# Patient Record
Sex: Male | Born: 1965 | Race: Black or African American | Hispanic: No | Marital: Married | Smoking: Never smoker
Health system: Southern US, Community
[De-identification: ages and names within clinical notes are randomized; demographics above are authoritative.]

---

## 2007-05-06 ENCOUNTER — Emergency Department (HOSPITAL_COMMUNITY): Admission: EM | Admit: 2007-05-06 | Discharge: 2007-05-06 | Payer: Self-pay | Admitting: Emergency Medicine

## 2007-07-25 ENCOUNTER — Emergency Department (HOSPITAL_COMMUNITY): Admission: EM | Admit: 2007-07-25 | Discharge: 2007-07-25 | Payer: Self-pay | Admitting: Emergency Medicine

## 2010-03-20 ENCOUNTER — Emergency Department (HOSPITAL_BASED_OUTPATIENT_CLINIC_OR_DEPARTMENT_OTHER)
Admission: EM | Admit: 2010-03-20 | Discharge: 2010-03-20 | Disposition: A | Discharge status: Home / Self Care | Payer: BC Managed Care – PPO | Attending: Emergency Medicine | Admitting: Emergency Medicine

## 2010-03-20 DIAGNOSIS — J069 Acute upper respiratory infection, unspecified: Secondary | ICD-10-CM | POA: Insufficient documentation

## 2010-10-08 LAB — CBC
MCHC: 33.8
MCV: 91.2
Platelets: 243
RBC: 4.77
RDW: 12.4

## 2010-10-08 LAB — POCT CARDIAC MARKERS: Myoglobin, poc: 84.7

## 2010-10-08 LAB — POCT I-STAT, CHEM 8
Creatinine, Ser: 1.5
HCT: 42
Hemoglobin: 14.3
Sodium: 140
TCO2: 26

## 2010-10-08 LAB — DIFFERENTIAL
Basophils Absolute: 0
Basophils Relative: 1
Eosinophils Absolute: 0.1
Monocytes Relative: 8
Neutro Abs: 3.6
Neutrophils Relative %: 47

## 2010-10-10 LAB — BASIC METABOLIC PANEL
BUN: 11
Calcium: 9.3
Creatinine, Ser: 1.19
GFR calc non Af Amer: >60
Glucose, Bld: 103 — ABNORMAL HIGH

## 2010-10-10 LAB — DIFFERENTIAL
Basophils Relative: 0
Eosinophils Absolute: 0.1
Monocytes Absolute: 1
Monocytes Relative: 8

## 2010-10-10 LAB — CBC
Hemoglobin: 15.3
MCHC: 35.2
MCV: 89.2
RBC: 4.87
RDW: 12.4

## 2013-03-29 ENCOUNTER — Emergency Department (HOSPITAL_BASED_OUTPATIENT_CLINIC_OR_DEPARTMENT_OTHER)
Admission: EM | Admit: 2013-03-29 | Discharge: 2013-03-29 | Disposition: A | Discharge status: Home / Self Care | Payer: BC Managed Care – PPO | Attending: Emergency Medicine | Admitting: Emergency Medicine

## 2013-03-29 ENCOUNTER — Encounter (HOSPITAL_BASED_OUTPATIENT_CLINIC_OR_DEPARTMENT_OTHER): Payer: Self-pay | Admitting: Emergency Medicine

## 2013-03-29 ENCOUNTER — Emergency Department (HOSPITAL_BASED_OUTPATIENT_CLINIC_OR_DEPARTMENT_OTHER): Payer: BC Managed Care – PPO

## 2013-03-29 DIAGNOSIS — Y939 Activity, unspecified: Secondary | ICD-10-CM | POA: Insufficient documentation

## 2013-03-29 DIAGNOSIS — Y929 Unspecified place or not applicable: Secondary | ICD-10-CM | POA: Insufficient documentation

## 2013-03-29 DIAGNOSIS — X58XXXA Exposure to other specified factors, initial encounter: Secondary | ICD-10-CM | POA: Insufficient documentation

## 2013-03-29 DIAGNOSIS — S39012A Strain of muscle, fascia and tendon of lower back, initial encounter: Secondary | ICD-10-CM

## 2013-03-29 DIAGNOSIS — S335XXA Sprain of ligaments of lumbar spine, initial encounter: Secondary | ICD-10-CM | POA: Insufficient documentation

## 2013-03-29 LAB — URINALYSIS, ROUTINE W REFLEX MICROSCOPIC
Bilirubin Urine: NEGATIVE
GLUCOSE, UA: NEGATIVE mg/dL
Hgb urine dipstick: NEGATIVE
Ketones, ur: NEGATIVE mg/dL
LEUKOCYTES UA: NEGATIVE
NITRITE: NEGATIVE
PH: 5.5 (ref 5.0–8.0)
Protein, ur: NEGATIVE mg/dL
SPECIFIC GRAVITY, URINE: 1.016 (ref 1.005–1.030)
Urobilinogen, UA: 0.2 mg/dL (ref 0.0–1.0)

## 2013-03-29 MED ORDER — IBUPROFEN 800 MG PO TABS: 800.0000 mg | ORAL_TABLET | Freq: Three times a day (TID) | ORAL | Status: DC

## 2013-03-29 MED ORDER — PREDNISONE 10 MG PO TABS: ORAL_TABLET | ORAL | Status: DC

## 2013-03-29 NOTE — ED Provider Notes (Signed)
CSN: [id number]     Arrival date & time 03/29/13  1810 History   First MD Initiated Contact with Patient 03/29/13 1837     Chief Complaint  Patient presents with  . Back Pain     (Consider location/radiation/quality/duration/timing/severity/associated sxs/prior Treatment) Patient is a 48 y.o. male presenting with back pain. No language interpreter was used.  Back Pain Location:  Lumbar spine Quality:  Aching Radiates to:  Does not radiate Pain severity:  Moderate Pain is:  Same all the time Onset quality:  Gradual Timing:  Constant Progression:  Worsening Chronicity:  New Relieved by:  Nothing Worsened by:  Nothing tried Ineffective treatments:  None tried Associated symptoms: no abdominal pain, no fever, no headaches, no leg pain, no paresthesias and no weakness   Pt saw his MD and was given medications for pain.   Pt reports no relief and pain continues  History reviewed. No pertinent past medical history. History reviewed. No pertinent past surgical history. No family history on file. History  Substance Use Topics  . Smoking status: Never Smoker   . Smokeless tobacco: Not on file  . Alcohol Use: No    Review of Systems  Constitutional: Negative for fever.  Gastrointestinal: Negative for abdominal pain.  Musculoskeletal: Positive for back pain and myalgias.  Neurological: Negative for weakness, headaches and paresthesias.  All other systems reviewed and are negative.      Allergies  Review of patient's allergies indicates no known allergies.  Home Medications   Current Outpatient Rx  Name  Route  Sig  Dispense  Refill  . Hydrocodone-Acetaminophen (VICODIN PO)   Oral   Take by mouth.          BP 135/87  Pulse 88  Temp(Src) 98.2 F (36.8 C) (Oral)  Resp 20  Ht 5\' 10"  (1.778 m)  Wt 250 lb (113.399 kg)  BMI 35.87 kg/m2  SpO2 100% Physical Exam  Nursing note and vitals reviewed. Constitutional: He is oriented to person, place, and time. He appears  well-developed and well-nourished.  HENT:  Head: Normocephalic.  Right Ear: External ear normal.  Left Ear: External ear normal.  Eyes: Conjunctivae and EOM are normal. Pupils are equal, round, and reactive to light.  Neck: Normal range of motion.  Cardiovascular: Normal rate and normal heart sounds.   Pulmonary/Chest: Effort normal.  Abdominal: Soft. He exhibits no distension.  Musculoskeletal:  Diffusely tender ls spine,   Decreased range of mtoion,   From lower extremities  Neurological: He is alert and oriented to person, place, and time.  Psychiatric: He has a normal mood and affect.    ED Course  Procedures (including critical care time) Labs Review Labs Reviewed  URINALYSIS, ROUTINE W REFLEX MICROSCOPIC   Imaging Review Dg Lumbar Spine Complete  03/29/2013   CLINICAL DATA:  Worsening low back pain.  EXAM: LUMBAR SPINE - COMPLETE 4+ VIEW  COMPARISON:  None.  FINDINGS: There is no evidence of lumbar spine fracture.  Alignment is normal.  Mild degenerative disc disease is seen at most lumbar levels, and mild facet DJD is seen bilaterally at L5-S1. 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  IMPRESSION: No acute findings.  Mild degenerative spondylosis, as described above.   Electronically Signed   By: Earle Gell M.D.   On: 03/29/2013 19:19     EKG Interpretation None      MDM   Final diagnoses:  Lumbar strain    rx for prednisone and ibuprofen.   Schedule to see  Dr. Barbaraann Barthel for evaluation    Fransico Meadow, PA-C 03/29/13 2200

## 2013-03-29 NOTE — ED Notes (Signed)
Lower back pain x 3 weeks. States he has been taking Vicodin for the pain since last Thursday. No known injury.

## 2013-03-29 NOTE — Discharge Instructions (Signed)
Back Exercises °Back exercises help treat and prevent back injuries. The goal of back exercises is to increase the strength of your abdominal and back muscles and the flexibility of your back. These exercises should be started when you no longer have back pain. Back exercises include: °· Pelvic Tilt. Lie on your back with your knees bent. Tilt your pelvis until the lower part of your back is against the floor. Hold this position 5 to 10 sec and repeat 5 to 10 times. °· Knee to Chest. Pull first 1 knee up against your chest and hold for 20 to 30 seconds, repeat this with the other knee, and then both knees. This may be done with the other leg straight or bent, whichever feels better. °· Sit-Ups or Curl-Ups. Bend your knees 90 degrees. Start with tilting your pelvis, and do a partial, slow sit-up, lifting your trunk only 30 to 45 degrees off the floor. Take at least 2 to 3 seconds for each sit-up. Do not do sit-ups with your knees out straight. If partial sit-ups are difficult, simply do the above but with only tightening your abdominal muscles and holding it as directed. °· Hip-Lift. Lie on your back with your knees flexed 90 degrees. Push down with your feet and shoulders as you raise your hips a couple inches off the floor; hold for 10 seconds, repeat 5 to 10 times. °· Back arches. Lie on your stomach, propping yourself up on bent elbows. Slowly press on your hands, causing an arch in your low back. Repeat 3 to 5 times. Any initial stiffness and discomfort should lessen with repetition over time. °· Shoulder-Lifts. Lie face down with arms beside your body. Keep hips and torso pressed to floor as you slowly lift your head and shoulders off the floor. °Do not overdo your exercises, especially in the beginning. Exercises may cause you some mild back discomfort which lasts for a few minutes; however, if the pain is more severe, or lasts for more than 15 minutes, do not continue exercises until you see your caregiver.  Improvement with exercise therapy for back problems is slow.  °See your caregivers for assistance with developing a proper back exercise program. °Document Released: 02/07/2004 Document Revised: 03/24/2011 Document Reviewed: 10/31/2010 °Exit[employer]® Patient Information ©2014 [employer]. ° °Back Pain, Adult °Low back pain is very common. About 1 in 5 people have back pain. The cause of low back pain is rarely dangerous. The pain often gets better over time. About half of people with a sudden onset of back pain feel better in just 2 weeks. About 8 in 10 people feel better by 6 weeks.  °CAUSES °Some common causes of back pain include: °· Strain of the muscles or ligaments supporting the spine. °· Wear and tear (degeneration) of the spinal discs. °· Arthritis. °· Direct injury to the back. °DIAGNOSIS °Most of the time, the direct cause of low back pain is not known. However, back pain can be treated effectively even when the exact cause of the pain is unknown. Answering your caregiver's questions about your overall health and symptoms is one of the most accurate ways to make sure the cause of your pain is not dangerous. If your caregiver needs more information, he or she may order lab work or imaging tests (X-rays or MRIs). However, even if imaging tests show changes in your back, this usually does not require surgery. °HOME CARE INSTRUCTIONS °For many people, back pain returns. Since low back pain is rarely dangerous, it is often a condition that people   can learn to manage on their own.  °· Remain active. It is stressful on the back to sit or stand in one place. Do not sit, drive, or stand in one place for more than 30 minutes at a time. Take short walks on level surfaces as soon as pain allows. Try to increase the length of time you walk each day. °· Do not stay in bed. Resting more than 1 or 2 days can delay your recovery. °· Do not avoid exercise or work. Your body is made to move. It is not dangerous to be active,  even though your back may hurt. Your back will likely heal faster if you return to being active before your pain is gone. °· Pay attention to your body when you  bend and lift. Many people have less discomfort when lifting if they bend their knees, keep the load close to their bodies, and avoid twisting. Often, the most comfortable positions are those that put less stress on your recovering back. °· Find a comfortable position to sleep. Use a firm mattress and lie on your side with your knees slightly bent. If you lie on your back, put a pillow under your knees. °· Only take over-the-counter or prescription medicines as directed by your caregiver. Over-the-counter medicines to reduce pain and inflammation are often the most helpful. Your caregiver may prescribe muscle relaxant drugs. These medicines help dull your pain so you can more quickly return to your normal activities and healthy exercise. °· Put ice on the injured area. °· Put ice in a plastic bag. °· Place a towel between your skin and the bag. °· Leave the ice on for 15-20 minutes, 03-04 times a day for the first 2 to 3 days. After that, ice and heat may be alternated to reduce pain and spasms. °· Ask your caregiver about trying back exercises and gentle massage. This may be of some benefit. °· Avoid feeling anxious or stressed. Stress increases muscle tension and can worsen back pain. It is important to recognize when you are anxious or stressed and learn ways to manage it. Exercise is a great option. °SEEK MEDICAL CARE IF: °· You have pain that is not relieved with rest or medicine. °· You have pain that does not improve in 1 week. °· You have new symptoms. °· You are generally not feeling well. °SEEK IMMEDIATE MEDICAL CARE IF:  °· You have pain that radiates from your back into your legs. °· You develop new bowel or bladder control problems. °· You have unusual weakness or numbness in your arms or legs. °· You develop nausea or vomiting. °· You develop  abdominal pain. °· You feel faint. °Document Released: 12/30/2004 Document Revised: 07/01/2011 Document Reviewed: 05/20/2010 °ExitCare® Patient Information ©2014 [employer]. ° °

## 2013-03-30 NOTE — ED Provider Notes (Signed)
Medical screening examination/treatment/procedure(s) were performed by non-physician practitioner and as supervising physician I was immediately available for consultation/collaboration.   EKG Interpretation None        Emmanual Gauthreaux, MD 03/30/13 1430 

## 2014-08-29 DIAGNOSIS — M48061 Spinal stenosis, lumbar region without neurogenic claudication: Secondary | ICD-10-CM | POA: Insufficient documentation

## 2014-08-30 DIAGNOSIS — R339 Retention of urine, unspecified: Secondary | ICD-10-CM | POA: Insufficient documentation

## 2014-09-11 DIAGNOSIS — Z09 Encounter for follow-up examination after completed treatment for conditions other than malignant neoplasm: Secondary | ICD-10-CM | POA: Insufficient documentation

## 2015-09-25 IMAGING — CR DG LUMBAR SPINE COMPLETE 4+V
5 series · 5 of 5 positions shown · non-contrast
Comparison: None.

CLINICAL DATA: Worsening low back pain.

EXAM:
LUMBAR SPINE - COMPLETE 4+ VIEW

[t l-spine a.p.]
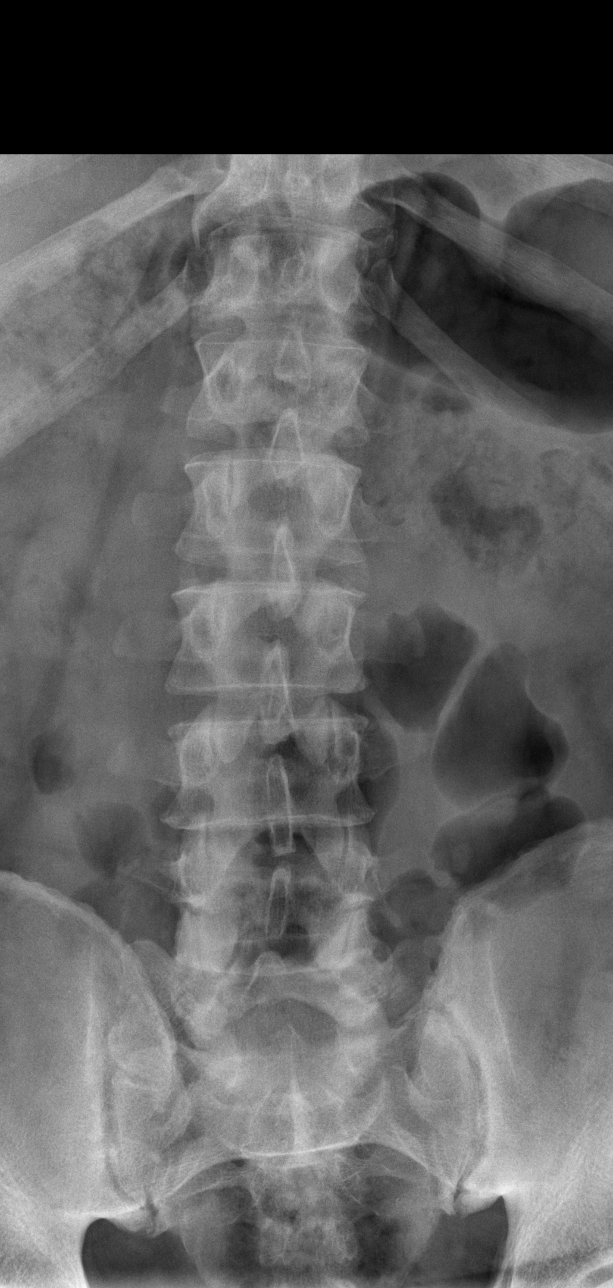

[t l-spine oblique exposure (1 of 2)]
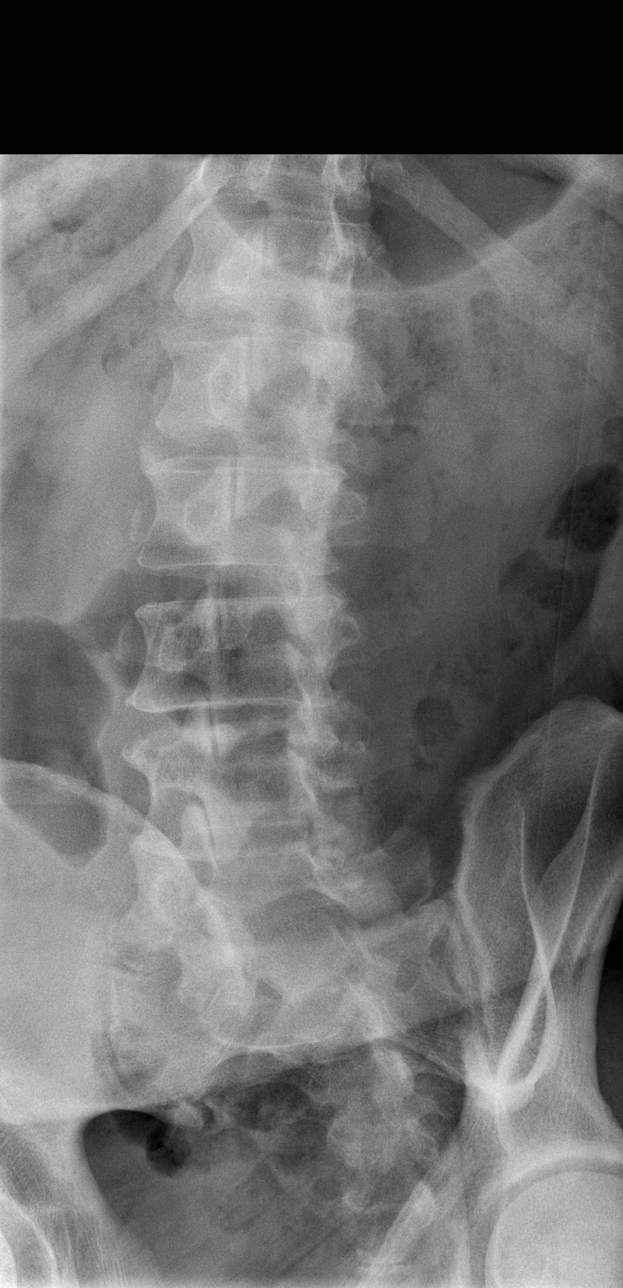

[t l-spine oblique exposure (2 of 2)]
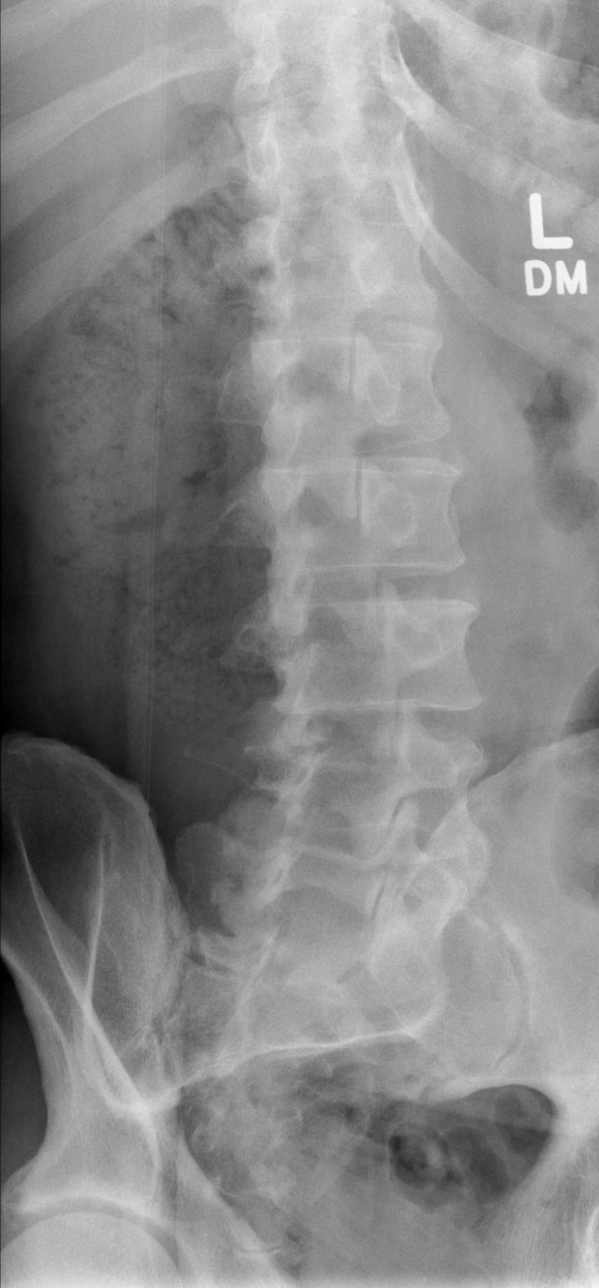

[t l-spine lat]
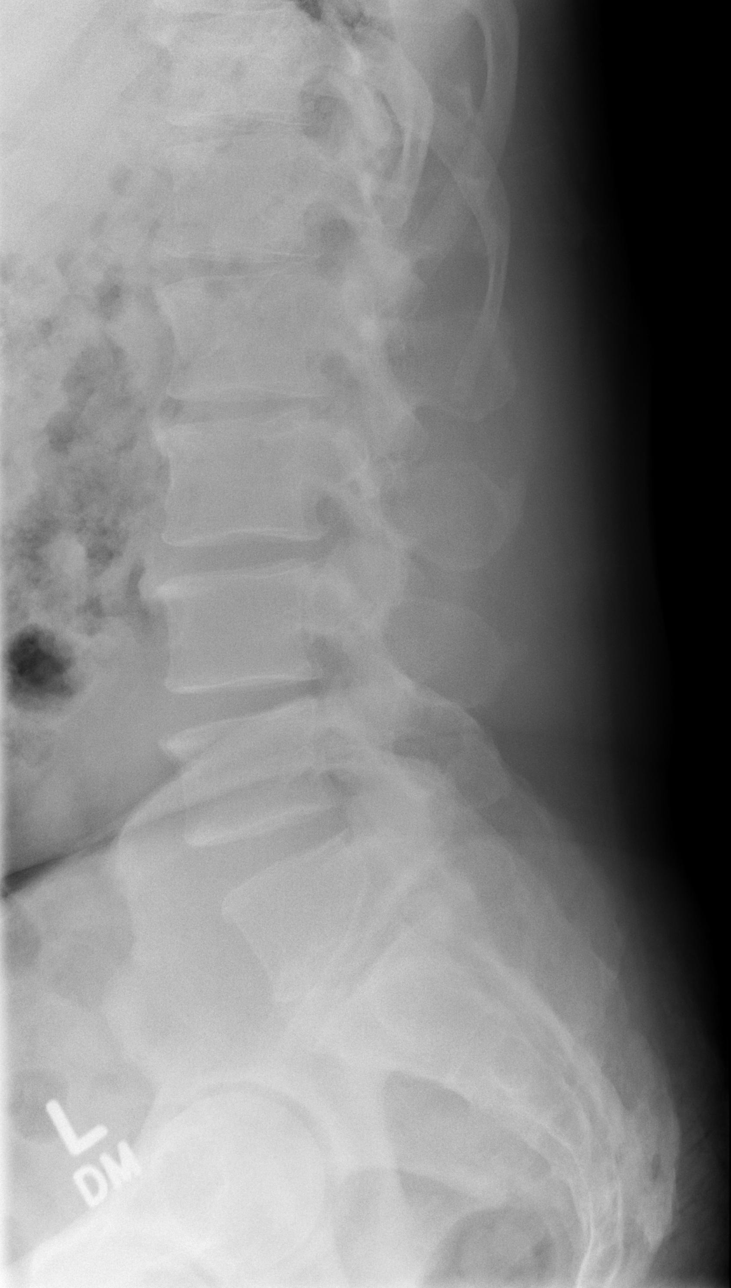

[t l-spine l5-s1 spot]
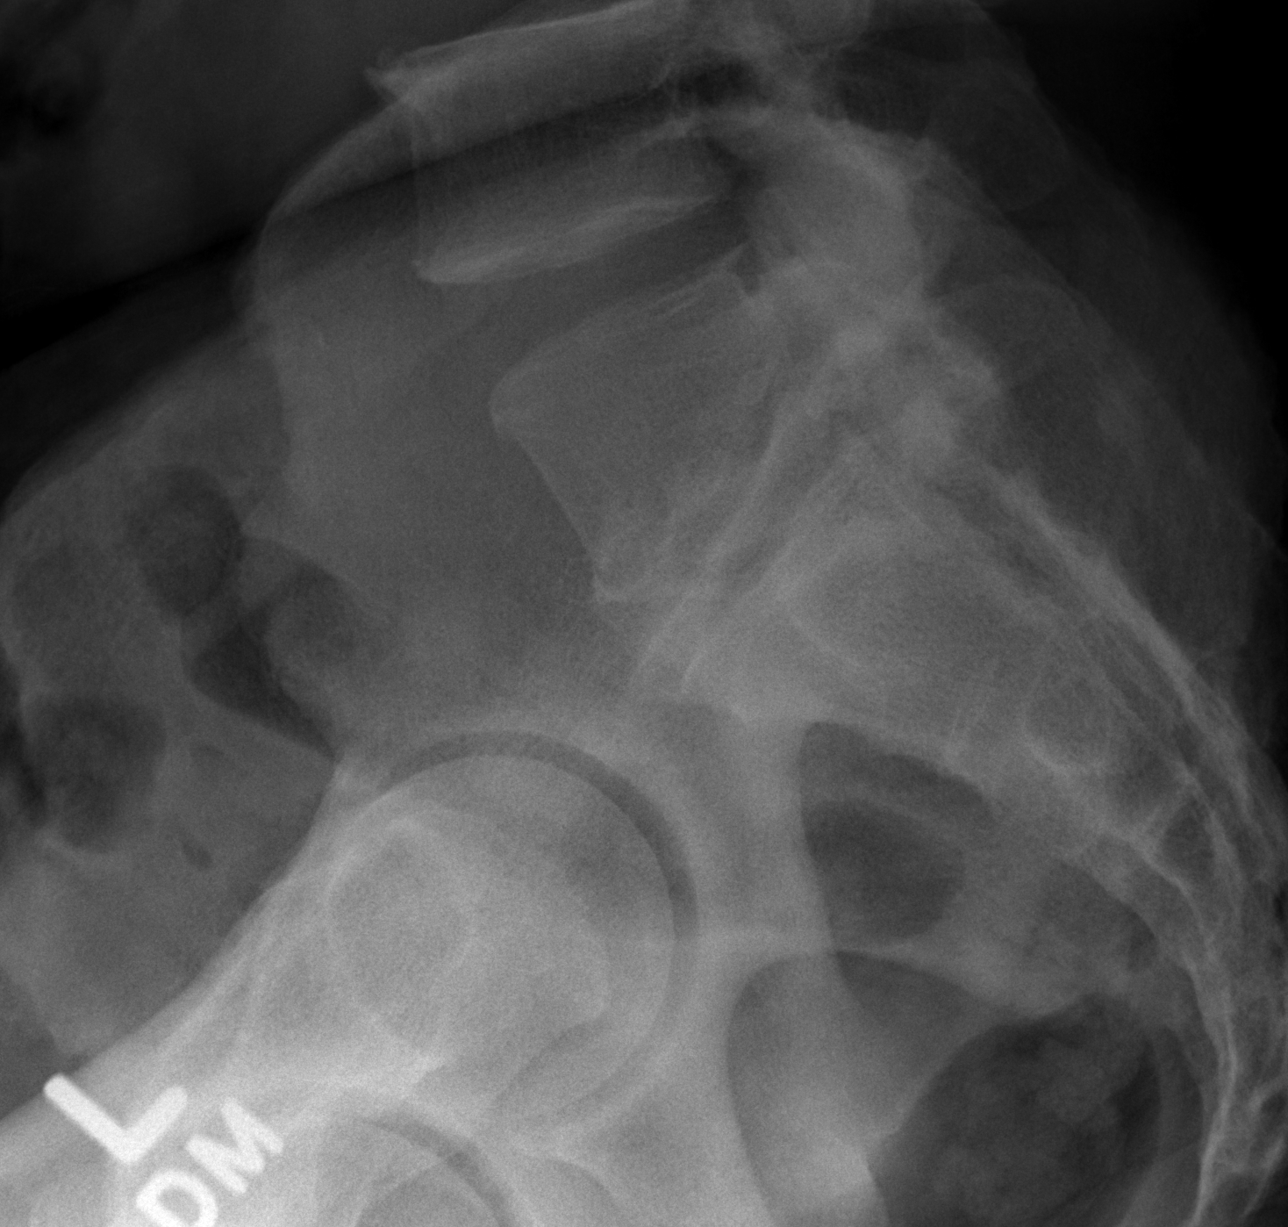

[5 of 5 positions shown; findings below may reference images not displayed]

FINDINGS: There is no evidence of lumbar spine fracture.  Alignment is normal.

Mild degenerative disc disease is seen at most lumbar levels, and
mild facet DJD is seen bilaterally at L5-S1. Transitional
lumbosacral vertebra noted at S1.
IMPRESSION: No acute findings.

Mild degenerative spondylosis, as described above.

## 2015-11-20 DIAGNOSIS — M4722 Other spondylosis with radiculopathy, cervical region: Secondary | ICD-10-CM | POA: Insufficient documentation

## 2015-11-20 DIAGNOSIS — G5621 Lesion of ulnar nerve, right upper limb: Secondary | ICD-10-CM | POA: Insufficient documentation

## 2016-06-19 DIAGNOSIS — Z9889 Other specified postprocedural states: Secondary | ICD-10-CM | POA: Insufficient documentation

## 2018-05-26 DIAGNOSIS — M47812 Spondylosis without myelopathy or radiculopathy, cervical region: Secondary | ICD-10-CM | POA: Insufficient documentation

## 2019-02-22 ENCOUNTER — Institutional Professional Consult (permissible substitution): Payer: BC Managed Care – PPO | Admitting: Emergency Medicine

## 2022-01-27 ENCOUNTER — Encounter: Payer: Self-pay | Admitting: Family Medicine

## 2022-01-27 ENCOUNTER — Ambulatory Visit (INDEPENDENT_AMBULATORY_CARE_PROVIDER_SITE_OTHER): Payer: BC Managed Care – PPO | Admitting: Family Medicine

## 2022-01-27 VITALS — BP 128/80 | HR 70 | Temp 98.1°F | Resp 16 | Ht 70.0 in | Wt 267.0 lb

## 2022-01-27 DIAGNOSIS — I10 Essential (primary) hypertension: Secondary | ICD-10-CM | POA: Diagnosis not present

## 2022-01-27 DIAGNOSIS — Z85820 Personal history of malignant melanoma of skin: Secondary | ICD-10-CM | POA: Insufficient documentation

## 2022-01-27 DIAGNOSIS — H524 Presbyopia: Secondary | ICD-10-CM | POA: Insufficient documentation

## 2022-01-27 DIAGNOSIS — R519 Headache, unspecified: Secondary | ICD-10-CM | POA: Insufficient documentation

## 2022-01-27 DIAGNOSIS — G4733 Obstructive sleep apnea (adult) (pediatric): Secondary | ICD-10-CM | POA: Diagnosis not present

## 2022-01-27 DIAGNOSIS — R634 Abnormal weight loss: Secondary | ICD-10-CM | POA: Insufficient documentation

## 2022-01-27 DIAGNOSIS — E781 Pure hyperglyceridemia: Secondary | ICD-10-CM | POA: Insufficient documentation

## 2022-01-27 DIAGNOSIS — N529 Male erectile dysfunction, unspecified: Secondary | ICD-10-CM | POA: Insufficient documentation

## 2022-01-27 DIAGNOSIS — R222 Localized swelling, mass and lump, trunk: Secondary | ICD-10-CM | POA: Insufficient documentation

## 2022-01-27 DIAGNOSIS — M25511 Pain in right shoulder: Secondary | ICD-10-CM | POA: Insufficient documentation

## 2022-01-27 DIAGNOSIS — R079 Chest pain, unspecified: Secondary | ICD-10-CM | POA: Insufficient documentation

## 2022-01-27 DIAGNOSIS — L821 Other seborrheic keratosis: Secondary | ICD-10-CM | POA: Insufficient documentation

## 2022-01-27 DIAGNOSIS — E785 Hyperlipidemia, unspecified: Secondary | ICD-10-CM | POA: Insufficient documentation

## 2022-01-27 DIAGNOSIS — Z23 Encounter for immunization: Secondary | ICD-10-CM | POA: Diagnosis not present

## 2022-01-27 DIAGNOSIS — M779 Enthesopathy, unspecified: Secondary | ICD-10-CM | POA: Insufficient documentation

## 2022-01-27 DIAGNOSIS — Z6838 Body mass index (BMI) 38.0-38.9, adult: Secondary | ICD-10-CM

## 2022-01-27 DIAGNOSIS — J338 Other polyp of sinus: Secondary | ICD-10-CM | POA: Insufficient documentation

## 2022-01-27 DIAGNOSIS — H35411 Lattice degeneration of retina, right eye: Secondary | ICD-10-CM | POA: Insufficient documentation

## 2022-01-27 DIAGNOSIS — Z7729 Contact with and (suspected ) exposure to other hazardous substances: Secondary | ICD-10-CM | POA: Insufficient documentation

## 2022-01-27 DIAGNOSIS — K219 Gastro-esophageal reflux disease without esophagitis: Secondary | ICD-10-CM | POA: Insufficient documentation

## 2022-01-27 DIAGNOSIS — M545 Low back pain, unspecified: Secondary | ICD-10-CM | POA: Insufficient documentation

## 2022-01-27 DIAGNOSIS — G5620 Lesion of ulnar nerve, unspecified upper limb: Secondary | ICD-10-CM | POA: Insufficient documentation

## 2022-01-27 DIAGNOSIS — R972 Elevated prostate specific antigen [PSA]: Secondary | ICD-10-CM | POA: Insufficient documentation

## 2022-01-27 DIAGNOSIS — Z7689 Persons encountering health services in other specified circumstances: Secondary | ICD-10-CM

## 2022-01-27 DIAGNOSIS — G5600 Carpal tunnel syndrome, unspecified upper limb: Secondary | ICD-10-CM | POA: Insufficient documentation

## 2022-01-27 DIAGNOSIS — J309 Allergic rhinitis, unspecified: Secondary | ICD-10-CM | POA: Insufficient documentation

## 2022-01-27 DIAGNOSIS — G43909 Migraine, unspecified, not intractable, without status migrainosus: Secondary | ICD-10-CM | POA: Insufficient documentation

## 2022-01-27 DIAGNOSIS — H35033 Hypertensive retinopathy, bilateral: Secondary | ICD-10-CM | POA: Insufficient documentation

## 2022-01-27 DIAGNOSIS — D4860 Neoplasm of uncertain behavior of unspecified breast: Secondary | ICD-10-CM | POA: Insufficient documentation

## 2022-01-27 DIAGNOSIS — Z3141 Encounter for fertility testing: Secondary | ICD-10-CM | POA: Insufficient documentation

## 2022-01-27 DIAGNOSIS — M5030 Other cervical disc degeneration, unspecified cervical region: Secondary | ICD-10-CM | POA: Insufficient documentation

## 2022-01-27 DIAGNOSIS — F5221 Male erectile disorder: Secondary | ICD-10-CM | POA: Insufficient documentation

## 2022-01-27 DIAGNOSIS — G4730 Sleep apnea, unspecified: Secondary | ICD-10-CM | POA: Insufficient documentation

## 2022-01-27 DIAGNOSIS — M542 Cervicalgia: Secondary | ICD-10-CM | POA: Insufficient documentation

## 2022-01-27 NOTE — Progress Notes (Signed)
New Patient Office Visit  Subjective    Patient ID: Glenn Edwards, male    DOB: 03/31/1965  Age: 57 y.o. MRN: [id number]  CC: No chief complaint on file.   HPI Glenn Edwards presents to establish care and for review of chronic med issues. Patient reports that he also has a PCP at the [place] and desires to keep both especially because he is on 100% disability.Marland Kitchen He denies acute complaints or concerns.    Outpatient Encounter Medications as of 01/27/2022  Medication Sig   ibuprofen (ADVIL) 200 MG tablet 1 tablet with food or milk as needed Orally Three times a day   ibuprofen (ADVIL) 800 MG tablet 1 tablet with food or milk as needed Orally Three times a day   losartan (COZAAR) 100 MG tablet TAKE ONE-HALF TABLET BY MOUTH DAILY - CALL IF BLOOD PRESSURE GREATER THAN 130/80 AVERAGE TO ADJUST TO 1 TABLET DAILY AS DIRECTED   [DISCONTINUED] gabapentin (NEURONTIN) 100 MG capsule Take by mouth.   [DISCONTINUED] gabapentin (NEURONTIN) 300 MG capsule Take by mouth.   [DISCONTINUED] LOSARTAN POTASSIUM-HCTZ PO    [DISCONTINUED] losartan-hydrochlorothiazide (HYZAAR) 100-25 MG tablet    [DISCONTINUED] meloxicam (MOBIC) 15 MG tablet Take by mouth.   [DISCONTINUED] oxyCODONE-acetaminophen (PERCOCET/ROXICET) 5-325 MG tablet Take by mouth.   [DISCONTINUED] sildenafil (VIAGRA) 100 MG tablet    [DISCONTINUED] traMADol (ULTRAM) 50 MG tablet Take by mouth.   [DISCONTINUED] tretinoin (RETIN-A) 0.025 % cream APPLY SMALL AMOUNT TO AFFECTED AREA NIGHTLY FOR ACNE   [DISCONTINUED] Hydrocodone-Acetaminophen (VICODIN PO) Take by mouth.   [DISCONTINUED] ibuprofen (ADVIL,MOTRIN) 800 MG tablet Take 1 tablet (800 mg total) by mouth 3 (three) times daily.   [DISCONTINUED] predniSONE (DELTASONE) 10 MG tablet 6,5,4,3,2,1 taper   [DISCONTINUED] tiZANidine (ZANAFLEX) 4 MG tablet Take by mouth.   [DISCONTINUED] vardenafil (LEVITRA) 20 MG tablet TAKE ONE-HALF TABLET BY MOUTH AS INSTRUCTED AS NEEDED (TAKE 1 HOUR PRIOR TO SEXUAL ACTIVITY  *DO NOT EXCEED 1 DOSE PER 24 HOUR PERIOD*). MAY INCREASE TO ONE TABLET ANOTHER DAY IF HALF TABLET INEFFECTIVE; STOP TADALAFIL   No facility-administered encounter medications on file as of 01/27/2022.    History reviewed. No pertinent past medical history.  History reviewed. No pertinent surgical history.  History reviewed. No pertinent family history.  Social History   Socioeconomic History   Marital status: Married    Spouse name: Not on file   Number of children: Not on file   Years of education: Not on file   Highest education level: Not on file  Occupational History   Not on file  Tobacco Use   Smoking status: Never   Smokeless tobacco: Not on file  Substance and Sexual Activity   Alcohol use: No   Drug use: No   Sexual activity: Not on file  Other Topics Concern   Not on file  Social History Narrative   Not on file   Social Determinants of Health   Financial Resource Strain: Not on file  Food Insecurity: Not on file  Transportation Needs: Not on file  Physical Activity: Not on file  Stress: Not on file  Social Connections: Not on file  Intimate Partner Violence: Not on file    Review of Systems  All other systems reviewed and are negative.       Objective    BP 128/80   Pulse 70   Temp 98.1 F (36.7 C) (Oral)   Resp 16   Ht '5\' 10"'$  (1.778 m)   Wt 267 lb (  121.1 kg)   SpO2 95%   BMI 38.31 kg/m   Physical Exam Vitals and nursing note reviewed.  Constitutional:      General: He is not in acute distress.    Appearance: He is obese.  Cardiovascular:     Rate and Rhythm: Normal rate and regular rhythm.  Pulmonary:     Effort: Pulmonary effort is normal.     Breath sounds: Normal breath sounds.  Neurological:     General: No focal deficit present.     Mental Status: He is alert and oriented to person, place, and time.         Assessment & Plan:   1. Essential (primary) hypertension Appears stable. Continue   2. Obstructive sleep  apnea (adult) (pediatric) Appears stable.   3. Need for immunization against influenza  - Flu Vaccine QUAD 26moIM (Fluarix, Fluzone & Alfiuria Quad PF)  4. Encounter to establish care     Return in about 1 year (around 01/28/2023) for follow up.   WBecky Sax MD

## 2022-01-27 NOTE — Progress Notes (Signed)
Patient is here to established care with provider today. Patient has many health concern they would like to discuss with provider today  Care gaps discuss at appointment today  

## 2022-01-29 ENCOUNTER — Encounter: Payer: Self-pay | Admitting: Family Medicine

## 2022-03-20 ENCOUNTER — Telehealth: Payer: Self-pay | Admitting: *Deleted

## 2022-03-20 NOTE — Telephone Encounter (Signed)
I spoke with patient and let him know that upper mgnt has taken care of this situation.  Copied from [place] [id number]. Topic: General - Other >> Mar 18, 2022  2:07 PM Chapman Fitch wrote: Reason for CRM: [employer] looked up Dr. Dois Davenport NPI and she is in the pts network but listed as inactive/ pt wants to stay with Dr. Redmond Pulling and called to see if this is an error or if she will become active in the network again / please advise and update pt asap / insurance is trying to place pt with another provider in another city
